# Patient Record
Sex: Male | Born: 1990 | Race: White | Hispanic: No | Marital: Single | Smoking: Current some day smoker
Health system: Southern US, Community
[De-identification: ages and names within clinical notes are randomized; demographics above are authoritative.]

---
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
## 2010-08-17 LAB — BASIC METABOLIC PANEL
Calcium: 9 mg/dL (ref 8.4–10.5)
Creatinine, Ser: 1.04 mg/dL (ref 0.4–1.5)
Glucose, Bld: 176 mg/dL — ABNORMAL HIGH (ref 70–99)
Potassium: 3.8 mEq/L (ref 3.5–5.1)
Sodium: 140 mEq/L (ref 135–145)

## 2010-08-17 LAB — RAPID URINE DRUG SCREEN, HOSP PERFORMED
Amphetamines: NOT DETECTED
Benzodiazepines: NOT DETECTED
Cocaine: NOT DETECTED
Opiates: NOT DETECTED
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
## 2011-02-03 LAB — COMPREHENSIVE METABOLIC PANEL
ALT: 16
Albumin: 4.1
CO2: 28
Potassium: 4.1

## 2011-02-03 LAB — DIFFERENTIAL
Basophils Absolute: 0
Basophils Relative: 0
Eosinophils Absolute: 0.1
Eosinophils Relative: 1
Monocytes Absolute: 0.8
Neutrophils Relative %: 67

## 2011-02-03 LAB — I-STAT 8, (EC8 V) (CONVERTED LAB)
BUN: 15
Bicarbonate: 24.6 — ABNORMAL HIGH
Chloride: 105
Glucose, Bld: 106 — ABNORMAL HIGH
HCT: 43
Hemoglobin: 14.6
Operator id: 272551
Potassium: 3.8
Sodium: 139
TCO2: 26
pCO2, Ven: 41.1 — ABNORMAL LOW
pH, Ven: 7.386 — ABNORMAL HIGH

## 2011-02-03 LAB — RAPID URINE DRUG SCREEN, HOSP PERFORMED
Amphetamines: NOT DETECTED
Barbiturates: NOT DETECTED
Cocaine: NOT DETECTED
Tetrahydrocannabinol: NOT DETECTED

## 2011-02-03 LAB — POCT I-STAT CREATININE
Creatinine, Ser: 1.1
Operator id: 272551

## 2011-02-03 LAB — CBC
HCT: 39.6
MCHC: 34.4
MCV: 84.4
Platelets: 286
RBC: 4.69
RDW: 14.4

## 2011-02-03 LAB — ETHANOL: Alcohol, Ethyl (B): <5

## 2011-02-22 LAB — CBC
HCT: 42.1
Hemoglobin: 14.4
MCV: 86.1
Platelets: 187 — ABNORMAL LOW
RDW: 13

## 2011-02-22 LAB — URINALYSIS, ROUTINE W REFLEX MICROSCOPIC
Glucose, UA: NEGATIVE
Leukocytes, UA: NEGATIVE
Protein, ur: NEGATIVE
Specific Gravity, Urine: 1.021
Urobilinogen, UA: 0.2

## 2011-02-22 LAB — CULTURE, BLOOD (ROUTINE X 2): Culture: NO GROWTH

## 2011-02-22 LAB — BASIC METABOLIC PANEL: Potassium: 3.6

## 2011-02-22 LAB — URINE MICROSCOPIC-ADD ON

## 2011-02-22 LAB — DIFFERENTIAL
Eosinophils Absolute: 0
Lymphs Abs: 0.7 — ABNORMAL LOW
Monocytes Absolute: 0.4
Monocytes Relative: 8
Neutrophils Relative %: 79 — ABNORMAL HIGH

## 2012-04-03 ENCOUNTER — Encounter (INDEPENDENT_AMBULATORY_CARE_PROVIDER_SITE_OTHER): Payer: BC Managed Care – PPO | Admitting: *Deleted

## 2012-04-03 DIAGNOSIS — M79609 Pain in unspecified limb: Secondary | ICD-10-CM

## 2012-04-05 ENCOUNTER — Other Ambulatory Visit: Payer: Self-pay | Admitting: *Deleted

## 2012-04-05 DIAGNOSIS — R200 Anesthesia of skin: Secondary | ICD-10-CM

## 2016-06-25 ENCOUNTER — Encounter (HOSPITAL_COMMUNITY): Payer: Self-pay | Admitting: Emergency Medicine

## 2016-06-25 ENCOUNTER — Emergency Department (HOSPITAL_COMMUNITY)
Admission: EM | Admit: 2016-06-25 | Discharge: 2016-06-25 | Disposition: A | Discharge status: Home / Self Care | Payer: Managed Care, Other (non HMO) | Attending: Emergency Medicine | Admitting: Emergency Medicine

## 2016-06-25 ENCOUNTER — Emergency Department (HOSPITAL_COMMUNITY): Payer: Managed Care, Other (non HMO)

## 2016-06-25 DIAGNOSIS — R1031 Right lower quadrant pain: Secondary | ICD-10-CM | POA: Diagnosis present

## 2016-06-25 DIAGNOSIS — R1011 Right upper quadrant pain: Secondary | ICD-10-CM | POA: Insufficient documentation

## 2016-06-25 DIAGNOSIS — F17200 Nicotine dependence, unspecified, uncomplicated: Secondary | ICD-10-CM | POA: Insufficient documentation

## 2016-06-25 DIAGNOSIS — R1010 Upper abdominal pain, unspecified: Secondary | ICD-10-CM

## 2016-06-25 LAB — COMPREHENSIVE METABOLIC PANEL
ALK PHOS: 35 U/L — AB (ref 38–126)
ALT: 29 U/L (ref 17–63)
ANION GAP: 10 (ref 5–15)
AST: 21 U/L (ref 15–41)
Albumin: 4.4 g/dL (ref 3.5–5.0)
BUN: 11 mg/dL (ref 6–20)
CHLORIDE: 107 mmol/L (ref 101–111)
CO2: 24 mmol/L (ref 22–32)
Calcium: 9.4 mg/dL (ref 8.9–10.3)
Creatinine, Ser: 1.19 mg/dL (ref 0.61–1.24)
GFR calc Af Amer: >60 mL/min (ref >60)
GFR calc non Af Amer: >60 mL/min (ref >60)
GLUCOSE: 90 mg/dL (ref 65–99)
Potassium: 4.1 mmol/L (ref 3.5–5.1)
SODIUM: 141 mmol/L (ref 135–145)
Total Bilirubin: 0.4 mg/dL (ref 0.3–1.2)
Total Protein: 7 g/dL (ref 6.5–8.1)

## 2016-06-25 LAB — CBC
HEMATOCRIT: 45.9 % (ref 39.0–52.0)
HEMOGLOBIN: 15.1 g/dL (ref 13.0–17.0)
MCH: 29.3 pg (ref 26.0–34.0)
MCHC: 32.9 g/dL (ref 30.0–36.0)
MCV: 89 fL (ref 78.0–100.0)
Platelets: 312 10*3/uL (ref 150–400)
RBC: 5.16 MIL/uL (ref 4.22–5.81)
RDW: 12.8 % (ref 11.5–15.5)
WBC: 7.5 10*3/uL (ref 4.0–10.5)

## 2016-06-25 LAB — LIPASE, BLOOD: LIPASE: 16 U/L (ref 11–51)

## 2016-06-25 NOTE — ED Triage Notes (Signed)
Pt c/o feeling under the weather for about a week, last night experienced sharp pain to right mid abdomen. Pt denies nausea or vomiting.

## 2016-06-25 NOTE — ED Notes (Signed)
Patient at u/s 

## 2016-06-25 NOTE — ED Notes (Signed)
Patient went to u/s 

## 2016-06-25 NOTE — ED Provider Notes (Signed)
[facility]ENCY DEPT Provider Note   CSN: [id number] Arrival date & time: 06/25/16  1339   By signing my name below, I, Nelwyn Salisbury, attest that this documentation has been prepared under the direction and in the presence of Bethann Berkshire, MD . Electronically Signed: Nelwyn Salisbury, Scribe. 06/25/2016. 4:11 PM.  History   Chief Complaint Chief Complaint  Patient presents with  . Abdominal Pain   The history is provided by the patient. No language interpreter was used.  Abdominal Pain   This is a new problem. The current episode started 12 to 24 hours ago. The problem occurs hourly. The problem has not changed since onset.The pain is associated with an unknown factor. The pain is located in the RLQ. The quality of the pain is sharp. The pain is at a severity of 4/10. The pain is mild. Nothing aggravates the symptoms. The symptoms are relieved by bowel activity.    HPI Comments:  Jacob Madden is a 26 y.o. male who presents to the Emergency Department complaining of intermittent, mild abdominal pain beginning about 12 hours ago. Pt states that he was woken up about 14 hours ago with a sharp pain in his side. His pain was temporarily relieved by having a bowel movement. He reports associated light-headedness and SOB. Pt has a fhx of gall-bladder problems and complications.   History reviewed. No pertinent past medical history.  There are no active problems to display for this patient.   History reviewed. No pertinent surgical history.     Home Medications    Prior to Admission medications   Not on File    Family History No family history on file.  Social History Social History  Substance Use Topics  . Smoking status: Current Some Day Smoker  . Smokeless tobacco: Never Used  . Alcohol use Yes     Allergies   Patient has no known allergies.   Review of Systems Review of Systems  Respiratory: Positive for shortness of breath.   Gastrointestinal: Positive for  abdominal pain.  Neurological: Positive for light-headedness.  All other systems reviewed and are negative.    Physical Exam Updated Vital Signs BP 107/65   Pulse 82   Temp 98.4 F (36.9 C) (Oral)   Resp 18   Ht 5\' 9"  (1.753 m)   Wt 248 lb (112.5 kg)   SpO2 97%   BMI 36.62 kg/m   Physical Exam  Constitutional: He is oriented to person, place, and time. He appears well-developed.  HENT:  Head: Normocephalic.  Eyes: Conjunctivae and EOM are normal. No scleral icterus.  Neck: Neck supple. No thyromegaly present.  Cardiovascular: Normal rate and regular rhythm.  Exam reveals no gallop and no friction rub.   No murmur heard. Pulmonary/Chest: No stridor. He has no wheezes. He has no rales. He exhibits no tenderness.  Abdominal: Soft. He exhibits no distension. There is no tenderness. There is no rebound.  Minor RUQ tenderness.   Musculoskeletal: Normal range of motion. He exhibits no edema.  Lymphadenopathy:    He has no cervical adenopathy.  Neurological: He is oriented to person, place, and time. He exhibits normal muscle tone. Coordination normal.  Skin: No rash noted. No erythema.  Psychiatric: He has a normal mood and affect. His behavior is normal.     ED Treatments / Results  DIAGNOSTIC STUDIES:  Oxygen Saturation is 97% on RA, normal by my interpretation.    COORDINATION OF CARE:  4:26 PM Discussed treatment plan with pt at bedside  which includes US and pt agreed to plan.  Labs (all labs ordered are listed, but only abnormal results are displayed) Labs Reviewed  COMPREHENSIVE METABOLIC PANEL - Abnormal; Notable for the following:       Result Value   Alkaline Phosphatase 35 (*)    All other components within normal limits  LIPASE, BLOOD  CBC  URINALYSIS, ROUTINE W REFLEX MICROSCOPIC    EKG  EKG Interpretation None       Radiology No results found.  Procedures Procedures (including critical care time)  Medications Ordered in ED Medications -  No data to display   Initial Impression / Assessment and Plan / ED Course  I have reviewed the triage vital signs and the nursing notes.  Pertinent labs & imaging results that were available during my care of the patient were reviewed by me and considered in my medical decision making (see chart for details).     Patient with right upper quadrant abdominal pain that has resolved. Labs unremarkable and UNREMARKABLE. PATIENT DOES HAVE A FAMILY HISTORY OF A SISTER HAVING HER GALLBLADDER OUT. HE WILL FOLLOW-UP WITH HIS PRIMARY CARE DOCTOR IF SYMPTOMS PERSIST  Final Clinical Impressions(s) / ED Diagnoses   Final diagnoses:  None    New Prescriptions New Prescriptions   No medications on file      Bethann BerkshireJoseph Pearl Bents, MD 06/25/16 1812

## 2016-06-25 NOTE — Discharge Instructions (Signed)
Follow up with your family md if any more problems

## 2019-01-01 IMAGING — US US ABDOMEN COMPLETE
1 series · 14 of 25 positions shown · non-contrast
Comparison: None.

CLINICAL DATA: Abdominal pain.

EXAM:
ABDOMEN ULTRASOUND COMPLETE

[Series 1: us abdomen complete · 0.26mm/px · 14 of 63 slices shown]
[im 1/63]
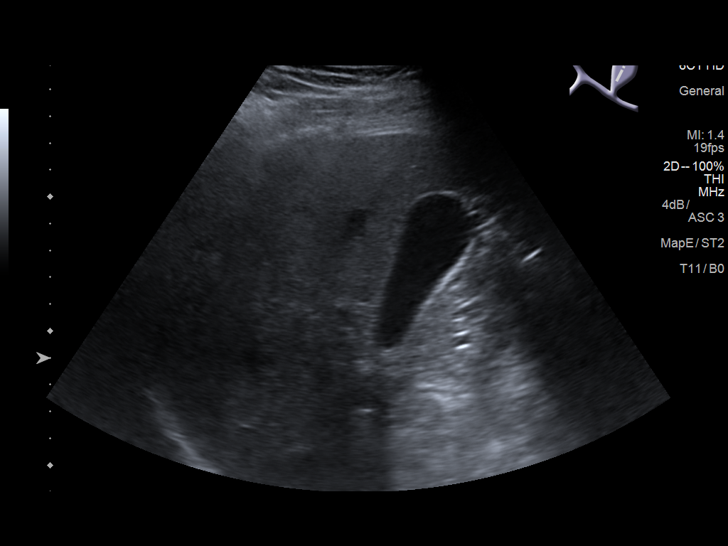
[im 6/63]
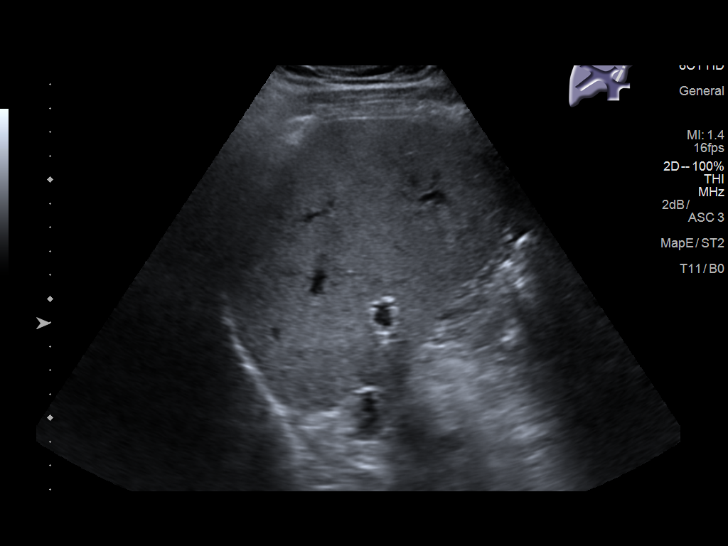
[im 11/63]
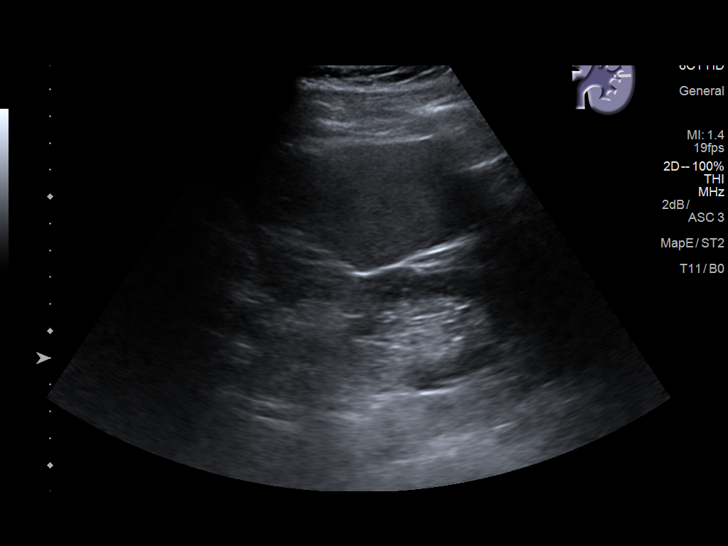
[im 16/63]
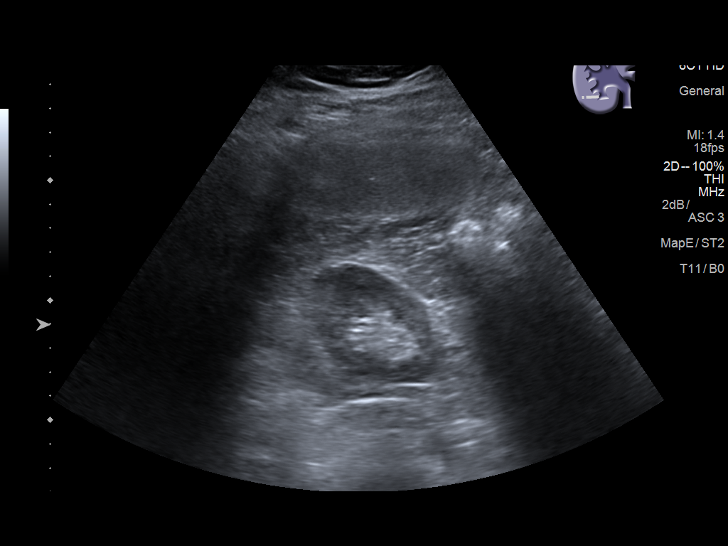
[im 21/63]
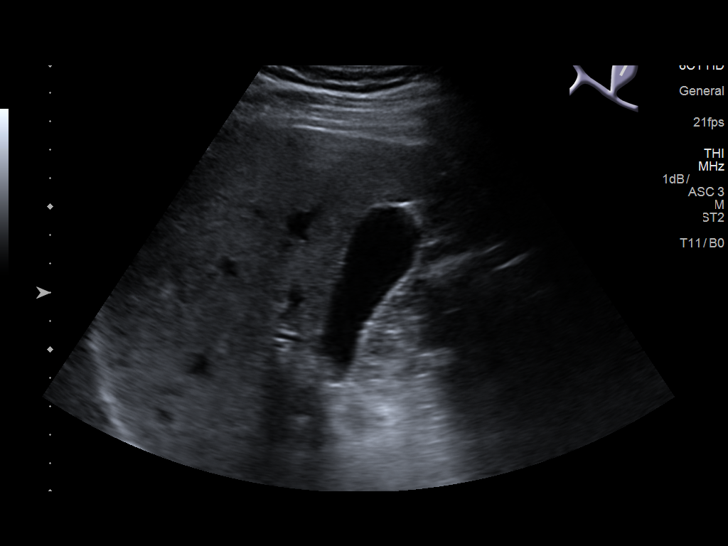
[im 24/63]
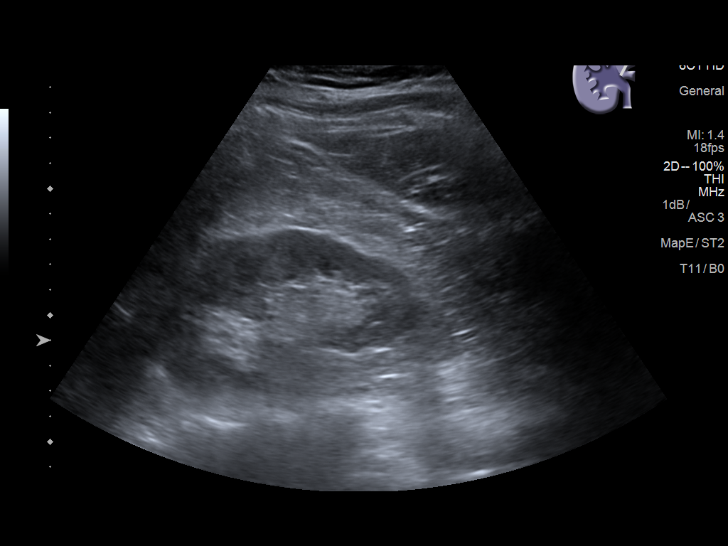
[im 29/63]
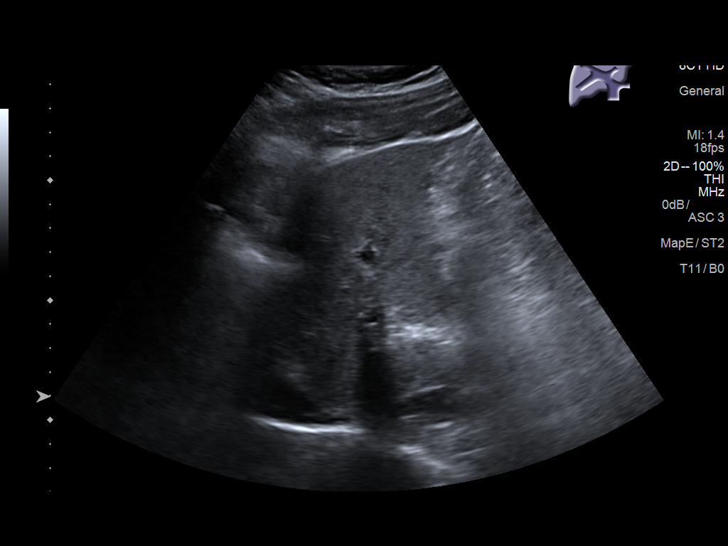
[im 34/63]
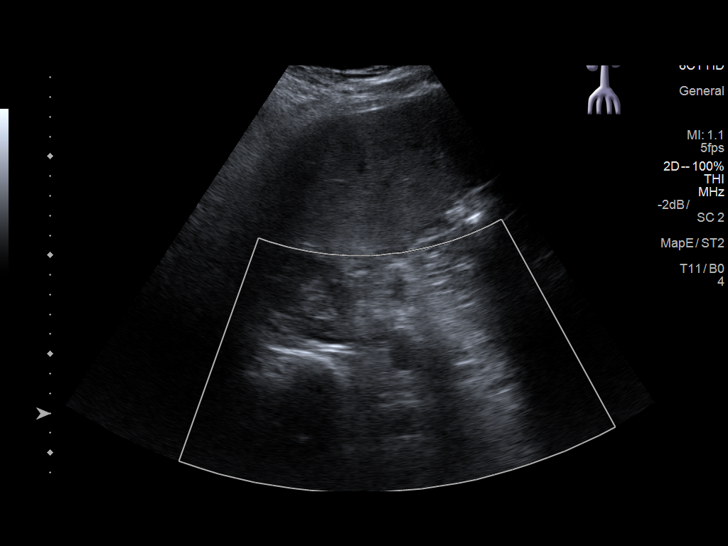
[im 39/63]
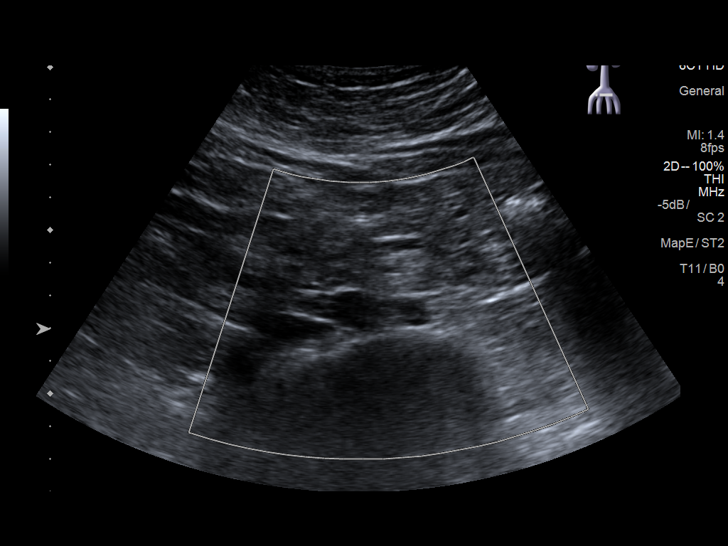
[im 42/63]
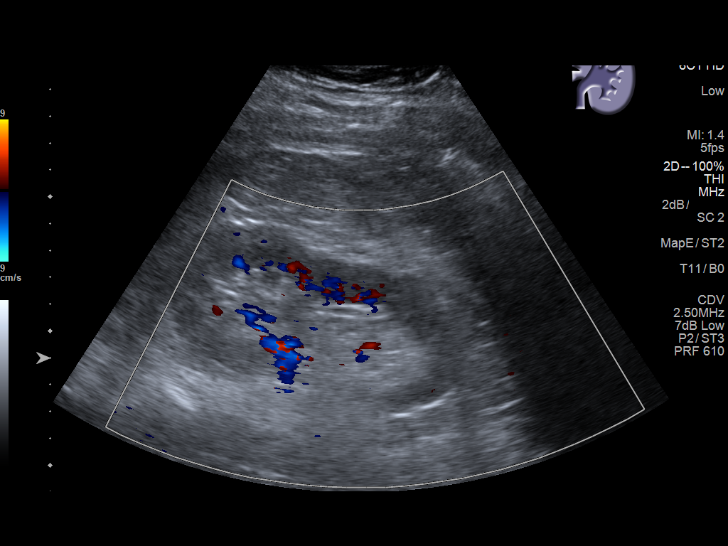
[im 47/63]
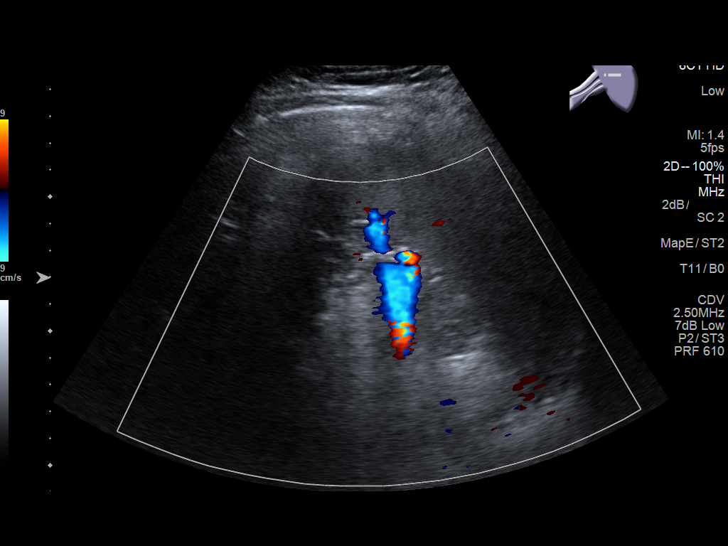
[im 52/63]
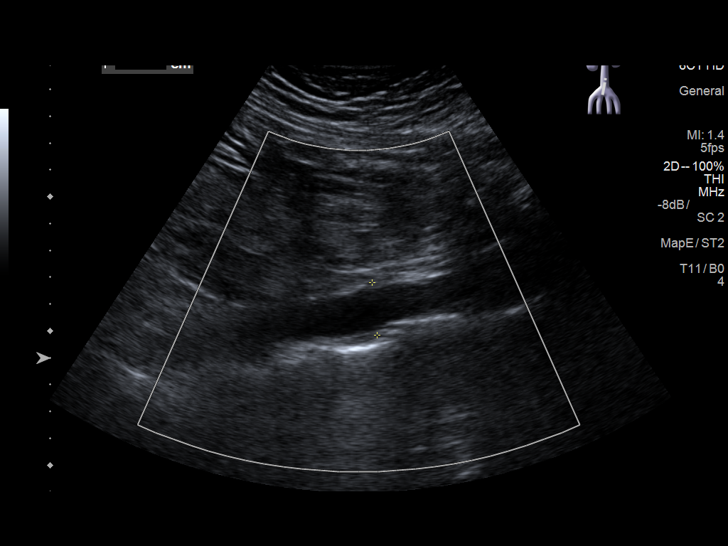
[im 57/63]
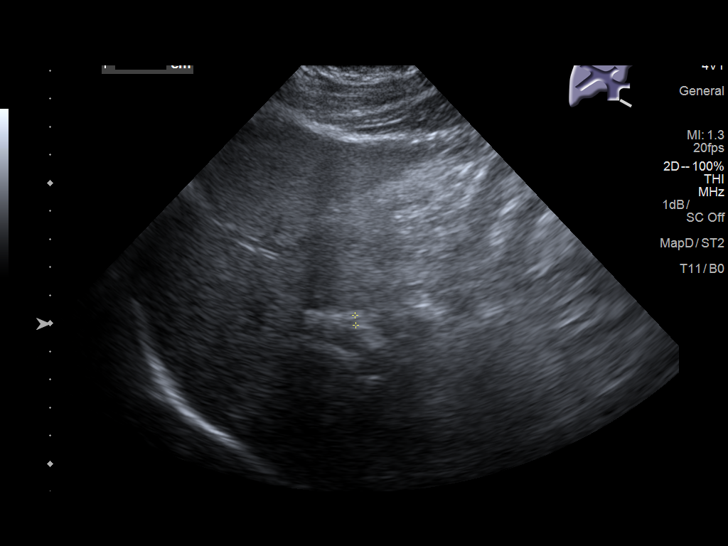
[im 63/63]
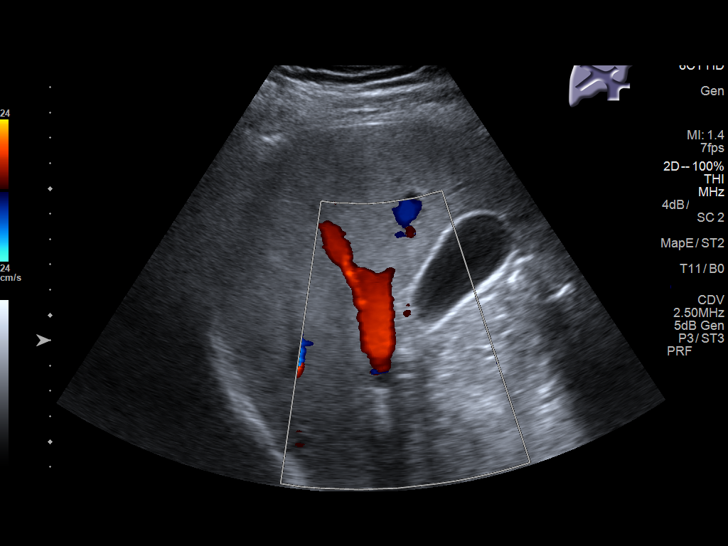

[14 of 25 positions shown; findings below may reference images not displayed]

FINDINGS: Gallbladder: No gallstones or wall thickening visualized. No
sonographic Murphy sign noted by sonographer.

Common bile duct: Diameter: 4 mm on limited images

Liver: No focal lesion identified. Within normal limits in
parenchymal echogenicity.

IVC: No abnormality visualized.

Pancreas: Not visualized due to shadowing bowel gas.

Spleen: Size and appearance within normal limits.

Right Kidney: Length: 11.5 cm. Echogenicity within normal limits. No
mass or hydronephrosis visualized.

Left Kidney: Length: 11.6 cm. Echogenicity within normal limits. No
mass or hydronephrosis visualized.

Abdominal aorta: No aneurysm visualized.

Other findings: None.
IMPRESSION: 1. The study is limited due to shadowing bowel gas. Within visualize
limits, no acute abnormality.

## 2023-01-15 ENCOUNTER — Encounter (HOSPITAL_COMMUNITY): Payer: Self-pay

## 2023-01-15 ENCOUNTER — Emergency Department (HOSPITAL_COMMUNITY): Payer: BC Managed Care – PPO

## 2023-01-15 ENCOUNTER — Other Ambulatory Visit: Payer: Self-pay

## 2023-01-15 ENCOUNTER — Emergency Department (HOSPITAL_COMMUNITY)
Admission: EM | Admit: 2023-01-15 | Discharge: 2023-01-15 | Disposition: A | Discharge status: Home / Self Care | Payer: BC Managed Care – PPO | Attending: Emergency Medicine | Admitting: Emergency Medicine

## 2023-01-15 DIAGNOSIS — R0602 Shortness of breath: Secondary | ICD-10-CM | POA: Insufficient documentation

## 2023-01-15 DIAGNOSIS — R079 Chest pain, unspecified: Secondary | ICD-10-CM | POA: Diagnosis not present

## 2023-01-15 DIAGNOSIS — R002 Palpitations: Secondary | ICD-10-CM | POA: Diagnosis present

## 2023-01-15 DIAGNOSIS — R5383 Other fatigue: Secondary | ICD-10-CM | POA: Diagnosis not present

## 2023-01-15 LAB — BASIC METABOLIC PANEL
Anion gap: 7 (ref 5–15)
BUN: 13 mg/dL (ref 6–20)
CO2: 25 mmol/L (ref 22–32)
Calcium: 9.1 mg/dL (ref 8.9–10.3)
Chloride: 105 mmol/L (ref 98–111)
Creatinine, Ser: 1.12 mg/dL (ref 0.61–1.24)
GFR, Estimated: >60 mL/min (ref >60)
Glucose, Bld: 103 mg/dL — ABNORMAL HIGH (ref 70–99)
Potassium: 4.3 mmol/L (ref 3.5–5.1)
Sodium: 137 mmol/L (ref 135–145)

## 2023-01-15 LAB — CBC
HCT: 45.3 % (ref 39.0–52.0)
Hemoglobin: 14.6 g/dL (ref 13.0–17.0)
MCH: 28.5 pg (ref 26.0–34.0)
MCHC: 32.2 g/dL (ref 30.0–36.0)
MCV: 88.5 fL (ref 80.0–100.0)
Platelets: 376 10*3/uL (ref 150–400)
RBC: 5.12 MIL/uL (ref 4.22–5.81)
RDW: 13 % (ref 11.5–15.5)
WBC: 9.2 10*3/uL (ref 4.0–10.5)
nRBC: 0 % (ref 0.0–0.2)

## 2023-01-15 LAB — D-DIMER, QUANTITATIVE: D-Dimer, Quant: <0.27 ug{FEU}/mL (ref 0.00–0.50)

## 2023-01-15 LAB — TROPONIN I (HIGH SENSITIVITY)
Troponin I (High Sensitivity): <2 ng/L (ref <18)
Troponin I (High Sensitivity): <2 ng/L (ref <18)

## 2023-01-15 LAB — TSH: TSH: 1.232 u[IU]/mL (ref 0.350–4.500)

## 2023-01-15 NOTE — ED Provider Notes (Signed)
[place] EMERGENCY DEPARTMENT AT [facility] Provider Note   CSN: [id number] Arrival date & time: 01/15/23  1158     History  Chief Complaint  Patient presents with   Chest Pain    Jacob Madden is a 32 y.o. male.  Yeah   Chest Pain Patient presents with chest pain, shortness of breath and fatigue.  Began a couple weeks ago.  States he will feel his heart rate going fast.  States will sometimes go up to 200 on his watch.  States he has had a fast heart rate in the past and saw cardiology but from what it sounds like they did not find a clear cause.  Has had more shortness of breath.  States he will feel his heart go fast.  States that will come on with exertion at times.  States when he is having his heart rate come down after stopping exertion is when he will get some pain.  Also will feel it at night times.  No swelling in his legs.  No cough.  No weight loss.  No known cardiac history.    History reviewed. No pertinent past medical history.  Home Medications Prior to Admission medications   Not on File      Allergies    Patient has no known allergies.    Review of Systems   Review of Systems  Cardiovascular:  Positive for chest pain.    Physical Exam Updated Vital Signs BP 116/63   Pulse 76   Temp 99 F (37.2 C) (Oral)   Resp 17   Ht 5\' 10"  (1.778 m)   Wt 106.6 kg   SpO2 98%   BMI 33.72 kg/m  Physical Exam Vitals and nursing note reviewed.  Cardiovascular:     Rate and Rhythm: Normal rate and regular rhythm.  Pulmonary:     Breath sounds: No decreased breath sounds.  Chest:     Chest wall: No tenderness.  Abdominal:     Tenderness: There is no abdominal tenderness.  Musculoskeletal:     Right lower leg: No tenderness. No edema.     Left lower leg: No tenderness. No edema.  Neurological:     Mental Status: He is alert.     ED Results / Procedures / Treatments   Labs (all labs ordered are listed, but only abnormal results are  displayed) Labs Reviewed  BASIC METABOLIC PANEL - Abnormal; Notable for the following components:      Result Value   Glucose, Bld 103 (*)    All other components within normal limits  CBC  D-DIMER, QUANTITATIVE  TSH  TROPONIN I (HIGH SENSITIVITY)  TROPONIN I (HIGH SENSITIVITY)    EKG EKG Interpretation Date/Time:  Monday January 15 2023 12:05:50 EDT Ventricular Rate:  95 PR Interval:  134 QRS Duration:  92 QT Interval:  342 QTC Calculation: 430 R Axis:   74  Text Interpretation: Sinus rhythm Abnormal inferior Q waves No significant change since last tracing Confirmed by Benjiman Core [phone]) on 01/15/2023 2:02:31 PM  Radiology DG Chest 2 View  Result Date: 01/15/2023 CLINICAL DATA:  Chest pain. EXAM: CHEST - 2 VIEW COMPARISON:  Chest x-ray April 01, 2009. FINDINGS: The heart size and mediastinal contours are within normal limits. Both lungs are clear. No visible pleural effusions or pneumothorax. No acute osseous abnormality. IMPRESSION: No active cardiopulmonary disease. Electronically Signed   By: Feliberto Harts M.D.   On: 01/15/2023 12:38    Procedures Procedures  Medications Ordered in ED Medications - No data to display  ED Course/ Medical Decision Making/ A&P                                 Medical Decision Making Amount and/or Complexity of Data Reviewed Labs: ordered. Radiology: ordered.   Patient with chest pain shortness of breath and fatigue.  No recent viral syndromes.  No cough.  Reportedly worse with exertion.  Differential diagnoses long but does include cardiac cause, pulmonary embolism, pneumonia.  X-ray reassuring.  Blood work reassuring but will add D-dimer.  Also will add TSH.  If negative should be able to discharge home with cardiology follow-up.  Care turned over to Dr. Freida Busman.        Final Clinical Impression(s) / ED Diagnoses Final diagnoses:  Palpitations    Rx / DC Orders ED Discharge Orders          Ordered     Ambulatory referral to Cardiology       Comments: If you have not heard from the Cardiology office within the next 72 hours please call [phone].   01/15/23 1510              Benjiman Core, MD 01/15/23 1514

## 2023-01-15 NOTE — ED Triage Notes (Signed)
Patient has had left sided chest pain that comes and goes that began a couple weeks ago. No nausea or vomiting. Increased light headed and dizziness and fatigue.

## 2023-10-21 ENCOUNTER — Other Ambulatory Visit: Payer: Self-pay

## 2023-10-21 ENCOUNTER — Emergency Department (HOSPITAL_COMMUNITY)

## 2023-10-21 ENCOUNTER — Emergency Department (HOSPITAL_COMMUNITY)
Admission: EM | Admit: 2023-10-21 | Discharge: 2023-10-21 | Disposition: A | Discharge status: Home / Self Care | Attending: Emergency Medicine | Admitting: Emergency Medicine

## 2023-10-21 DIAGNOSIS — R4020 Unspecified coma: Secondary | ICD-10-CM

## 2023-10-21 DIAGNOSIS — R55 Syncope and collapse: Secondary | ICD-10-CM | POA: Insufficient documentation

## 2023-10-21 DIAGNOSIS — D72829 Elevated white blood cell count, unspecified: Secondary | ICD-10-CM | POA: Insufficient documentation

## 2023-10-21 LAB — CBC
HCT: 41.4 % (ref 39.0–52.0)
Hemoglobin: 13.6 g/dL (ref 13.0–17.0)
MCH: 29.6 pg (ref 26.0–34.0)
MCHC: 32.9 g/dL (ref 30.0–36.0)
MCV: 90.2 fL (ref 80.0–100.0)
Platelets: 391 10*3/uL (ref 150–400)
RBC: 4.59 MIL/uL (ref 4.22–5.81)
RDW: 13.2 % (ref 11.5–15.5)
WBC: 10.7 10*3/uL — ABNORMAL HIGH (ref 4.0–10.5)
nRBC: 0 % (ref 0.0–0.2)

## 2023-10-21 LAB — COMPREHENSIVE METABOLIC PANEL WITH GFR
ALT: 24 U/L (ref 0–44)
AST: 17 U/L (ref 15–41)
Albumin: 3.8 g/dL (ref 3.5–5.0)
Alkaline Phosphatase: 28 U/L — ABNORMAL LOW (ref 38–126)
Anion gap: 11 (ref 5–15)
BUN: 8 mg/dL (ref 6–20)
CO2: 23 mmol/L (ref 22–32)
Calcium: 9.1 mg/dL (ref 8.9–10.3)
Chloride: 103 mmol/L (ref 98–111)
Creatinine, Ser: 0.99 mg/dL (ref 0.61–1.24)
GFR, Estimated: >60 mL/min (ref >60)
Glucose, Bld: 98 mg/dL (ref 70–99)
Potassium: 3.5 mmol/L (ref 3.5–5.1)
Sodium: 137 mmol/L (ref 135–145)
Total Bilirubin: 0.5 mg/dL (ref 0.0–1.2)
Total Protein: 6.4 g/dL — ABNORMAL LOW (ref 6.5–8.1)

## 2023-10-21 LAB — CBG MONITORING, ED: Glucose-Capillary: 90 mg/dL (ref 70–99)

## 2023-10-21 MED ORDER — SODIUM CHLORIDE 0.9 % IV BOLUS
1000.0000 mL | Freq: Once | INTRAVENOUS | Status: AC
  Administered 2023-10-21: 1000 mL via INTRAVENOUS

## 2023-10-21 MED ORDER — DIPHENHYDRAMINE HCL 50 MG/ML IJ SOLN
12.5000 mg | Freq: Once | INTRAMUSCULAR | Status: AC
  Administered 2023-10-21: 12.5 mg via INTRAVENOUS
  Filled 2023-10-21: qty 1

## 2023-10-21 MED ORDER — PROCHLORPERAZINE EDISYLATE 10 MG/2ML IJ SOLN
10.0000 mg | Freq: Once | INTRAMUSCULAR | Status: AC
  Administered 2023-10-21: 10 mg via INTRAVENOUS
  Filled 2023-10-21: qty 2

## 2023-10-21 NOTE — ED Notes (Signed)
 Patient transported to CT

## 2023-10-21 NOTE — Discharge Instructions (Signed)
 Evaluation today was reassuring.  Suspect dehydration and heat exposure likely contribute to your symptoms.  As we discussed continue assertive hydration at home and have a goal of a balanced diet.  Please use water and Gatorade for rehydration.  If you develop chest pain, shortness of breath, palpitations, visual disturbance, worsening headache, weakness or numbness in your extremities or any other concerning symptom please return to the ED for further evaluation.

## 2023-10-21 NOTE — ED Triage Notes (Signed)
 Pt BIB [facility] from home following a syncopal fall.   Pt had been doing yard work thought the day. Has had minimal to eat and drink throughout the day. This afternoon around 630 pm he had a sudden headache to the front right side of his head followed by dizziness that he described as the room is spinning. He was talking to his mother outside standing on the grass telling her he was going to go to urgent care the next day because he wasn't feeling good when he collapsed falling face forward. No notable injured. A&ox4 on arrival no c-collar. Reports ongoing dizziness, headache, and photosensitivity on arrival. Mother at bedside.   Per note, pt is on weight loss medication for the past 4 months - takes tripeptide 0.15 SQ weekly - last dose this morning.

## 2023-10-21 NOTE — ED Notes (Signed)
 AVS provided by edp was reviewed with pt. Pt verbalized understanding with no additional questions at this time. Pt to go home with family at bedside. Pt ambulatory at discharge.

## 2023-10-21 NOTE — ED Provider Notes (Signed)
 [place] EMERGENCY DEPARTMENT AT [facility] Provider Note   CSN: [id number] Arrival date & time: 10/21/23  2003     History  Chief Complaint  Patient presents with   Loss of Consciousness   HPI Jacob Madden is a 33 y.o. male presenting for loss of consciousness.  He states that he was [place] an amusement park [place] day in the sun and had very little to drink today.  He states he may have had a red bull but that is it.  He states he started to feel generally weak and nauseous later in the day and states it was "really hot outside".  When he returned home, he felt lightheaded and more nauseous and he was witnessed to "collapse".  His mother reports that she thinks he may have hit his head on the ground but then immediately return to consciousness.  No preceding chest pain, shortness of breath or palpitations.  Also reports "a really bad headache" and endorses sensitivity to light and sound.  Denies visual disturbance, focal numbness or weakness and gait ataxia.   Loss of Consciousness      Home Medications Prior to Admission medications   Medication Sig Start Date End Date Taking? Authorizing Provider  TREMFYA ONE-PRESS 100 MG/ML pen  09/21/23  Yes [provider]      Allergies    Patient has no known allergies.    Review of Systems   Review of Systems  Cardiovascular:  Positive for syncope.    Physical Exam Updated Vital Signs BP (!) 97/59   Pulse 62   Temp 98 F (36.7 C) (Oral)   Resp 13   Ht 5\' 10"  (1.778 m)   Wt 90.7 kg   SpO2 96%   BMI 28.70 kg/m  Physical Exam Vitals and nursing note reviewed.  HENT:     Head: Normocephalic and atraumatic.     Mouth/Throat:     Mouth: Mucous membranes are moist.  Eyes:     General:        Right eye: No discharge.        Left eye: No discharge.     Conjunctiva/sclera: Conjunctivae normal.  Cardiovascular:     Rate and Rhythm: Normal rate and regular rhythm.     Pulses: Normal pulses.     Heart  sounds: Normal heart sounds.  Pulmonary:     Effort: Pulmonary effort is normal.     Breath sounds: Normal breath sounds.  Abdominal:     General: Abdomen is flat.     Palpations: Abdomen is soft.  Skin:    General: Skin is warm and dry.  Neurological:     General: No focal deficit present.     Comments: GCS 15. Speech is goal oriented. No deficits appreciated to CN III-XII; symmetric eyebrow raise, no facial drooping, tongue midline. Patient has equal grip strength bilaterally with 5/5 strength against resistance in all major muscle groups bilaterally. Sensation to light touch intact. Patient moves extremities without ataxia. Normal finger-nose-finger. Patient ambulatory with steady gait.  Psychiatric:        Mood and Affect: Mood normal.     ED Results / Procedures / Treatments   Labs (all labs ordered are listed, but only abnormal results are displayed) Labs Reviewed  COMPREHENSIVE METABOLIC PANEL WITH GFR - Abnormal; Notable for the following components:      Result Value   Total Protein 6.4 (*)    Alkaline Phosphatase 28 (*)    All other  components within normal limits  CBC - Abnormal; Notable for the following components:   WBC 10.7 (*)    All other components within normal limits  CBG MONITORING, ED    EKG None  Radiology CT HEAD WO CONTRAST Result Date: 10/21/2023 CLINICAL DATA:  Sudden headache and dizziness after doing yard work outside today EXAM: CT HEAD WITHOUT CONTRAST TECHNIQUE: Contiguous axial images were obtained from the base of the skull through the vertex without intravenous contrast. RADIATION DOSE REDUCTION: This exam was performed according to the departmental dose-optimization program which includes automated exposure control, adjustment of the mA and/or kV according to patient size and/or use of iterative reconstruction technique. COMPARISON:  07/12/2007 FINDINGS: Brain: No evidence of acute infarction, hemorrhage, hydrocephalus, extra-axial collection or  mass lesion/mass effect. Vascular: No hyperdense vessel or unexpected calcification. Skull: Normal. Negative for fracture or focal lesion. Sinuses/Orbits: No acute finding. Other: None. IMPRESSION: No acute intracranial pathology. Electronically Signed   By: Fredricka Jenny M.D.   On: 10/21/2023 21:23    Procedures Procedures    Medications Ordered in ED Medications  sodium chloride 0.9 % bolus 1,000 mL (0 mLs Intravenous Stopped 10/21/23 2234)  prochlorperazine (COMPAZINE) injection 10 mg (10 mg Intravenous Given 10/21/23 2047)  diphenhydrAMINE (BENADRYL) injection 12.5 mg (12.5 mg Intravenous Given 10/21/23 2049)    ED Course/ Medical Decision Making/ A&P Clinical Course as of 10/21/23 2318  Sun Oct 21, 2023  2237 HCT: 41.4 [JR]    Clinical Course User Index [JR] Janalee Mcmurray, PA-C                                 Medical Decision Making Amount and/or Complexity of Data Reviewed Labs: ordered. Decision-making details documented in ED Course. Radiology: ordered.  Risk Prescription drug management.   Initial Impression and Ddx 33 year old well-appearing male presenting for loss of consciousness.  Exam was unremarkable.  DDx includes stroke, arrhythmia, electrolyte derangement, dehydration, other. Patient PMH that increases complexity of ED encounter:  none  Interpretation of Diagnostics - I independent reviewed and interpreted the labs as followed: mild leukocytosis  - I independently visualized the following imaging with scope of interpretation limited to determining acute life threatening conditions related to emergency care: CT head, which revealed no acute findings  -Personally reviewed and interpreted EKG which revealed sinus rhythm  Patient Reassessment and Ultimate Disposition/Management On reassessment, patient stated that headache was resolved and he was feeling much better.  Suspect his symptoms are likely related to dehydration and heat exposure.  He is ambulatory  fluid challenge with no issue.  Advised assertive rehydration at home supportive treatment.  Advised to follow-up with his PCP.  Discussed return precautions.  Discharge.  Patient management required discussion with the following services or consulting groups:  None  Complexity of Problems Addressed Acute complicated illness or Injury  Additional Data Reviewed and Analyzed Further history obtained from: Past medical history and medications listed in the EMR and Prior ED visit notes  Patient Encounter Risk Assessment Consideration of hospitalization         Final Clinical Impression(s) / ED Diagnoses Final diagnoses:  Loss of consciousness [facility])    Rx / DC Orders ED Discharge Orders     None         Janalee Mcmurray, PA-C 10/21/23 2319    Sallyanne Creamer, DO 10/23/23 1558

## 2023-11-30 ENCOUNTER — Encounter: Payer: Self-pay | Admitting: Advanced Practice Midwife
